# Patient Record
Sex: Female | Born: 1965 | Race: White | Hispanic: No | Smoking: Current some day smoker
Health system: Southern US, Community
[De-identification: ages and names within clinical notes are randomized; demographics above are authoritative.]

## PROBLEM LIST (undated history)

## (undated) DIAGNOSIS — T7840XA Allergy, unspecified, initial encounter: Secondary | ICD-10-CM

## (undated) DIAGNOSIS — K219 Gastro-esophageal reflux disease without esophagitis: Secondary | ICD-10-CM

## (undated) DIAGNOSIS — E079 Disorder of thyroid, unspecified: Secondary | ICD-10-CM

## (undated) HISTORY — PX: ABDOMINAL HYSTERECTOMY: SHX81

## (undated) HISTORY — PX: GALLBLADDER SURGERY: SHX652

## (undated) HISTORY — DX: Gastro-esophageal reflux disease without esophagitis: K21.9

## (undated) HISTORY — DX: Disorder of thyroid, unspecified: E07.9

## (undated) HISTORY — DX: Allergy, unspecified, initial encounter: T78.40XA

---

## 2010-05-25 ENCOUNTER — Ambulatory Visit: Payer: Self-pay | Admitting: Unknown Physician Specialty

## 2010-05-31 ENCOUNTER — Ambulatory Visit: Payer: Self-pay | Admitting: Unknown Physician Specialty

## 2011-04-05 ENCOUNTER — Ambulatory Visit: Payer: Self-pay | Admitting: Internal Medicine

## 2014-01-12 ENCOUNTER — Ambulatory Visit: Payer: BC Managed Care – PPO | Admitting: Podiatry

## 2014-01-12 ENCOUNTER — Encounter: Payer: Self-pay | Admitting: Podiatry

## 2014-01-12 VITALS — BP 126/94 | HR 115 | Resp 16 | Ht 65.0 in | Wt 164.0 lb

## 2014-01-12 DIAGNOSIS — L600 Ingrowing nail: Secondary | ICD-10-CM

## 2014-01-12 DIAGNOSIS — L608 Other nail disorders: Secondary | ICD-10-CM

## 2014-01-12 MED ORDER — NEOMYCIN-POLYMYXIN-HC 3.5-10000-1 OT SOLN: OTIC | Status: DC

## 2014-01-12 MED ORDER — NEOMYCIN-POLYMYXIN-HC 3.5-10000-1 OT SOLN: OTIC | Status: AC

## 2014-01-12 NOTE — Progress Notes (Signed)
   Subjective:    Patient ID: Ashlee ShadSandra Coppola, female    DOB: 1966-02-21, 48 y.o.   MRN: [id number]  HPI Comments: Have a problem with my toenails . They are thick and discolored , sometimes bleeds when they grow out. Painful , its been going on for years and getting worse . Try to keep them trimmed back so they do not hurt     Review of Systems  Constitutional: Positive for activity change.  All other systems reviewed and are negative.       Objective:   Physical Exam: Vital signs are stable she is alert and oriented x3 I have reviewed her past medical history medications allergies surgeries social history and review of systems. Pulses are palpable +2/4 DPPT bilateral. Capillary fill time to digits one through 5 bilateral is immediate. Neurologic sensorium is intact per Semmes-Weinstein monofilament to the forefoot midfoot and referred bilateral. Deep tendon reflexes are intact and brisk bilateral. Muscle strength was 5 over 5 dorsiflexors plantar flexors inverters everters all intrinsic musculature is intact. Orthopedic evaluation demonstrates all joints distal to the ankle a full range of motion without crepitation. Cutaneous evaluation demonstrates supple well hydrated cutis nail dystrophy to mini of the nails with sharp incurvated nail margins to the tibial and fibular borders of the hallux bilateral. These exquisitely painful palpation with erythema and edema and purulence. He also appear to be either dystrophic mycotic        Assessment & Plan:  Assessment: Ingrown nail tibial and fibular border hallux bilateral abscess paronychia. Nail dystrophy or onychomycosis hallux bilateral.  Plan: Discussed etiology pathology conservative or surgical therapies. Performed a chemical matrixectomy to the tibial and fibular border of the hallux bilateral under local anesthesia today she tolerated this procedure very well. She will start soaking on a twice a day basis in Betadine and water starting  tomorrow apply Cortisporin otic which was written for her. We did send his nail margins out for pathologic culture. And I will followup with her in one week.

## 2014-01-12 NOTE — Patient Instructions (Signed)

## 2014-01-19 ENCOUNTER — Encounter: Payer: Self-pay | Admitting: Podiatry

## 2014-01-19 ENCOUNTER — Ambulatory Visit: Payer: BC Managed Care – PPO | Admitting: Podiatry

## 2014-01-19 VITALS — BP 135/92 | HR 104 | Resp 16

## 2014-01-19 DIAGNOSIS — M79609 Pain in unspecified limb: Secondary | ICD-10-CM

## 2014-01-19 DIAGNOSIS — L600 Ingrowing nail: Secondary | ICD-10-CM

## 2014-01-19 NOTE — Progress Notes (Signed)
Both great toenails are doing fine. She continues to soak in Betadine and water. She states that they're doing great at absolutely no pain. Objective evaluation reveals vital signs are stable she is alert and oriented x3. Sharp incurvated nail margins were removed last week and she's doing quite well with him.  Objective: Vital signs are stable she is alert and oriented x3. There is no erythema edema cellulitis drainage or odor. Hallux nail margins appear to be healing quite nicely.  Assessment: Well-healing surgical foot discontinue Betadine.  Plan: She will discontinue Betadine she will start with Epsom salts warm water soaks continue to soak and to completely healed. We will notify her when her biopsies come back

## 2014-02-14 ENCOUNTER — Telehealth: Payer: Self-pay | Admitting: Podiatry

## 2014-02-14 ENCOUNTER — Encounter: Payer: Self-pay | Admitting: Podiatry

## 2014-02-14 NOTE — Telephone Encounter (Signed)
LEFT MESSAGE FOR Ashlee Guerrero TO SCHEDULE AN APPOINTMENT FOR PATHOLOGY RESULTS

## 2014-02-15 ENCOUNTER — Telehealth: Payer: Self-pay | Admitting: *Deleted

## 2014-02-15 NOTE — Telephone Encounter (Signed)
Pt stated she does not want to come in for pathology results. Said she works in [employer] and would have to take 1/2 day off. Did you want us to go over results over the phone with her or does pt have to come in?

## 2014-02-15 NOTE — Telephone Encounter (Signed)
You may go over the results with her by phone.  Let me know if she wants to do an oral antifungal.  Give me her information and I will help you with this.

## 2014-02-17 ENCOUNTER — Telehealth: Payer: Self-pay | Admitting: *Deleted

## 2014-02-17 NOTE — Telephone Encounter (Signed)
Left message letting her know per dr Al Corpushyatt fungus is postive, will need rx for lamisil and need blood work. Pt can come by to pick up information or make appt per dr Al Corpushyatt.

## 2014-03-23 ENCOUNTER — Ambulatory Visit (INDEPENDENT_AMBULATORY_CARE_PROVIDER_SITE_OTHER): Payer: BC Managed Care – PPO | Admitting: Podiatry

## 2014-03-23 VITALS — BP 152/109 | HR 111 | Resp 16

## 2014-03-23 DIAGNOSIS — B351 Tinea unguium: Secondary | ICD-10-CM

## 2014-03-23 MED ORDER — TERBINAFINE HCL 250 MG PO TABS
250.0000 mg | ORAL_TABLET | Freq: Every day | ORAL | Status: DC
Start: 2014-03-23 — End: 2014-05-04

## 2014-03-23 NOTE — Progress Notes (Signed)
She presents today for followup of her fungal results. We did get a positive for he for fungus the bilateral toenails.  Objective: Signs are stable she is alert and oriented x3. Onychomycosis.  Assessment: Onychomycosis.  Plan: We discussed the etiology pathology conservative versus surgical therapies today. We discussed laser therapy versus topical therapy versus oral therapy for onychomycosis. At this point we have chosen to go with oral Lamisil therapy. I wrote a prescription for 30 day dose of Lamisil. We also sent her out for liver profile and CBC. I will followup with her in one month for another set of blood work and a 90 day prescription of Lamisil.

## 2014-03-24 LAB — CBC WITH DIFFERENTIAL/PLATELET
Basophils Absolute: 0 10*3/uL (ref 0.0–0.2)
Basos: 1 %
EOS: 3 %
Eosinophils Absolute: 0.2 10*3/uL (ref 0.0–0.4)
HCT: 42.4 % (ref 34.0–46.6)
Hemoglobin: 14.4 g/dL (ref 11.1–15.9)
IMMATURE GRANULOCYTES: 0 %
Immature Grans (Abs): 0 10*3/uL (ref 0.0–0.1)
LYMPHS: 34 %
Lymphocytes Absolute: 2 10*3/uL (ref 0.7–3.1)
MCH: 31 pg (ref 26.6–33.0)
MCHC: 34 g/dL (ref 31.5–35.7)
MCV: 91 fL (ref 79–97)
MONOCYTES: 7 %
MONOS ABS: 0.4 10*3/uL (ref 0.1–0.9)
NEUTROS PCT: 55 %
Neutrophils Absolute: 3.3 10*3/uL (ref 1.4–7.0)
RBC: 4.64 x10E6/uL (ref 3.77–5.28)
RDW: 13.1 % (ref 12.3–15.4)
WBC: 6 10*3/uL (ref 3.4–10.8)

## 2014-03-24 LAB — HEPATIC FUNCTION PANEL: BILIRUBIN DIRECT: 0.11 mg/dL (ref 0.00–0.40)

## 2014-03-28 ENCOUNTER — Encounter: Payer: Self-pay | Admitting: Podiatry

## 2014-05-04 ENCOUNTER — Encounter: Payer: Self-pay | Admitting: Podiatry

## 2014-05-04 ENCOUNTER — Ambulatory Visit (INDEPENDENT_AMBULATORY_CARE_PROVIDER_SITE_OTHER): Payer: BC Managed Care – PPO | Admitting: Podiatry

## 2014-05-04 DIAGNOSIS — Z79899 Other long term (current) drug therapy: Secondary | ICD-10-CM

## 2014-05-04 MED ORDER — TERBINAFINE HCL 250 MG PO TABS: 250.0000 mg | ORAL_TABLET | Freq: Every day | ORAL | Status: AC

## 2014-05-04 NOTE — Progress Notes (Signed)
She presents today for followup of her Lamisil therapy. States that she's not see change as of yet to the nail plate. She denies fever chills nausea vomiting muscle aches pains rashes or itching.  Objective: Vital signs are stable she is alert and oriented x3. No change in nail plate.  Assessment: Onychomycosis.  Plan: Continue the use of Lamisil for another 90 days. A prescription for this was made today. We will also check the blood work with a CBC and liver profile. I will followup with her once his lab work come back otherwise I will see her in 4 months

## 2014-05-18 ENCOUNTER — Telehealth: Payer: Self-pay | Admitting: *Deleted

## 2014-05-18 LAB — CBC WITH DIFFERENTIAL/PLATELET
BASOS: 1 %
Basophils Absolute: 0 10*3/uL (ref 0.0–0.2)
EOS ABS: 0.2 10*3/uL (ref 0.0–0.4)
Eos: 2 %
HCT: 42.2 % (ref 34.0–46.6)
Hemoglobin: 14.6 g/dL (ref 11.1–15.9)
Immature Grans (Abs): 0 10*3/uL (ref 0.0–0.1)
Immature Granulocytes: 0 %
Lymphocytes Absolute: 2 10*3/uL (ref 0.7–3.1)
Lymphs: 28 %
MCH: 31.5 pg (ref 26.6–33.0)
MCHC: 34.6 g/dL (ref 31.5–35.7)
MCV: 91 fL (ref 79–97)
Monocytes Absolute: 0.5 10*3/uL (ref 0.1–0.9)
Monocytes: 8 %
NEUTROS ABS: 4.4 10*3/uL (ref 1.4–7.0)
Neutrophils Relative %: 61 %
RBC: 4.64 x10E6/uL (ref 3.77–5.28)
RDW: 13.5 % (ref 12.3–15.4)
WBC: 7.2 10*3/uL (ref 3.4–10.8)

## 2014-05-18 LAB — HEPATIC FUNCTION PANEL
ALBUMIN: 4.2 g/dL (ref 3.5–5.5)
ALK PHOS: 92 IU/L (ref 39–117)
ALT: 10 IU/L (ref 0–32)
AST: 15 IU/L (ref 0–40)
BILIRUBIN TOTAL: 0.3 mg/dL (ref 0.0–1.2)
Bilirubin, Direct: 0.07 mg/dL (ref 0.00–0.40)
Total Protein: 6.6 g/dL (ref 6.0–8.5)

## 2014-05-18 NOTE — Telephone Encounter (Signed)
Message copied by Bing ReeGUNN, Gabby Rackers L on Wed May 18, 2014 11:29 AM ------      Message from: Ernestene KielHYATT, MAX T      Created: Wed May 18, 2014  6:43 AM       Blood work looks perfect continue medication. ------

## 2014-05-18 NOTE — Telephone Encounter (Signed)
Left message regarding blood work looks good .

## 2014-09-07 ENCOUNTER — Ambulatory Visit: Payer: BC Managed Care – PPO | Admitting: Podiatry

## 2014-09-11 ENCOUNTER — Emergency Department: Payer: Self-pay | Admitting: Emergency Medicine

## 2014-09-11 LAB — CBC
HCT: 44.2 % (ref 35.0–47.0)
HGB: 14.8 g/dL (ref 12.0–16.0)
MCH: 31.3 pg (ref 26.0–34.0)
MCHC: 33.5 g/dL (ref 32.0–36.0)
MCV: 94 fL (ref 80–100)
PLATELETS: 215 10*3/uL (ref 150–440)
RBC: 4.72 10*6/uL (ref 3.80–5.20)
RDW: 12.7 % (ref 11.5–14.5)
WBC: 8 10*3/uL (ref 3.6–11.0)

## 2014-09-11 LAB — COMPREHENSIVE METABOLIC PANEL
ALK PHOS: 87 U/L
ANION GAP: 6 — AB (ref 7–16)
Albumin: 3.9 g/dL (ref 3.4–5.0)
BILIRUBIN TOTAL: 0.4 mg/dL (ref 0.2–1.0)
BUN: 11 mg/dL (ref 7–18)
CHLORIDE: 104 mmol/L (ref 98–107)
Calcium, Total: 9 mg/dL (ref 8.5–10.1)
Co2: 30 mmol/L (ref 21–32)
Creatinine: 1.06 mg/dL (ref 0.60–1.30)
EGFR (African American): >60
EGFR (Non-African Amer.): 58 — ABNORMAL LOW
Glucose: 119 mg/dL — ABNORMAL HIGH (ref 65–99)
Osmolality: 280 (ref 275–301)
Potassium: 3.1 mmol/L — ABNORMAL LOW (ref 3.5–5.1)
SGOT(AST): 18 U/L (ref 15–37)
SGPT (ALT): 22 U/L
SODIUM: 140 mmol/L (ref 136–145)
TOTAL PROTEIN: 7.4 g/dL (ref 6.4–8.2)

## 2015-04-25 IMAGING — CT CT HEAD WITHOUT CONTRAST
2 series · 16 of 30 positions shown, 20 images · non-contrast
Comparison: None.

CLINICAL DATA: Headache, dizziness, nausea.

EXAM:
CT HEAD WITHOUT CONTRAST
TECHNIQUE: Contiguous axial images were obtained from the base of the skull
through the vertex without intravenous contrast.

[Series 2: head wo · axial · 0.42mm/px · z∈[-193,-81]mm · 13 of 31 slices shown, 17 images]
[im 3/31  brain]
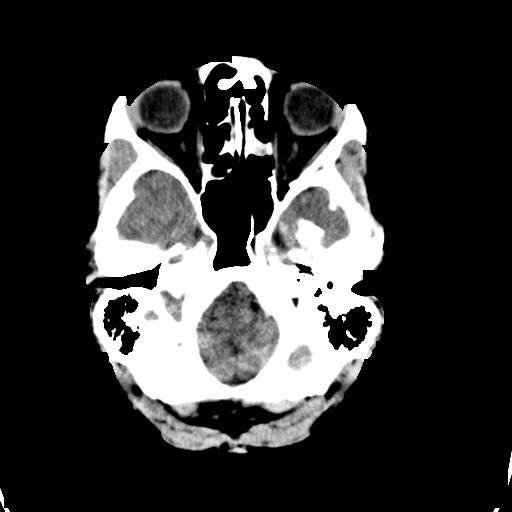
[im 3/31  bone]
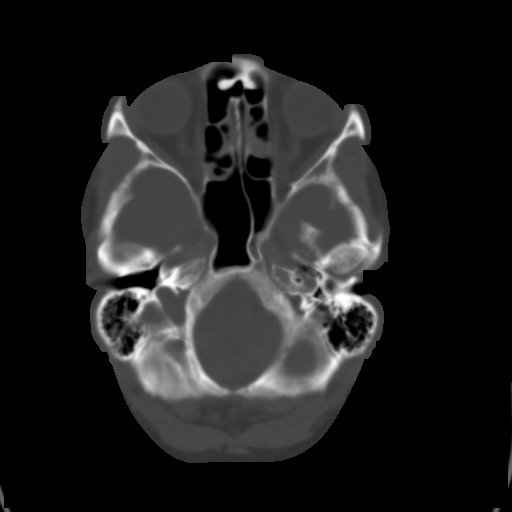
[im 5/31  brain]
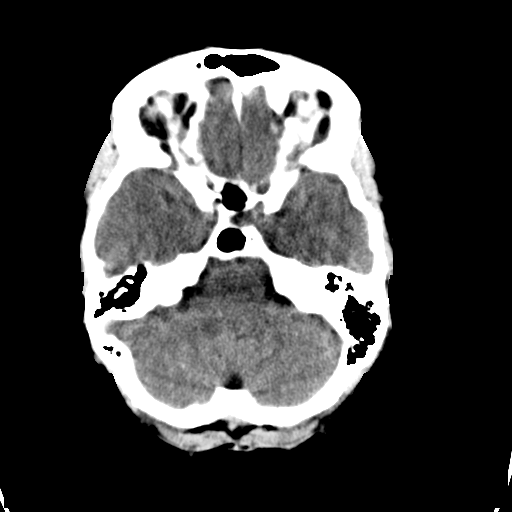
[im 7/31  brain]
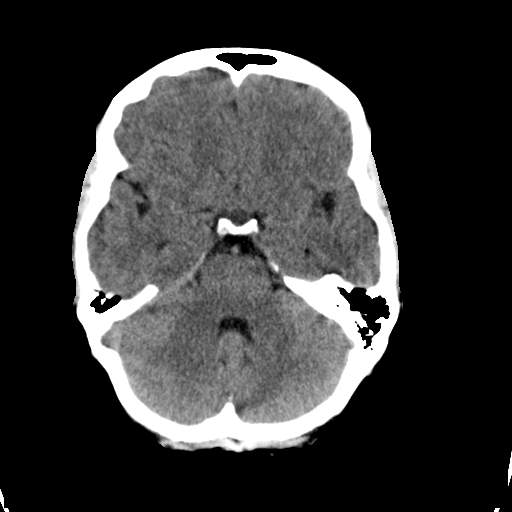
[im 9/31  brain]
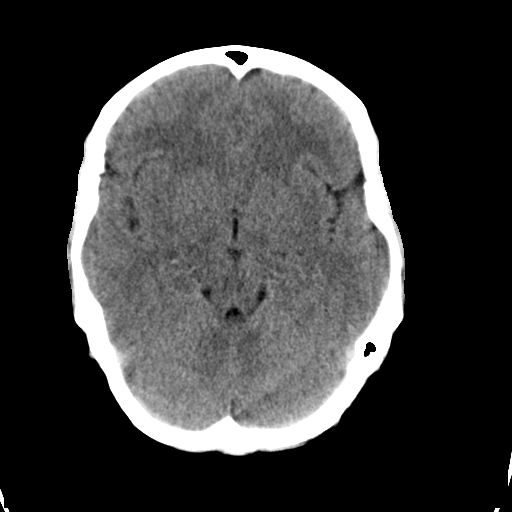
[im 11/31  brain]
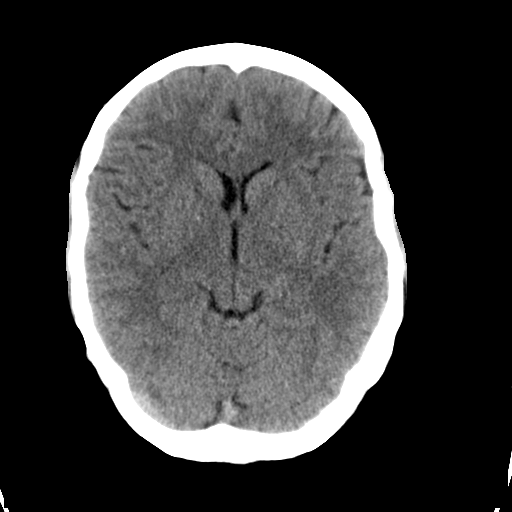
[im 11/31  bone]
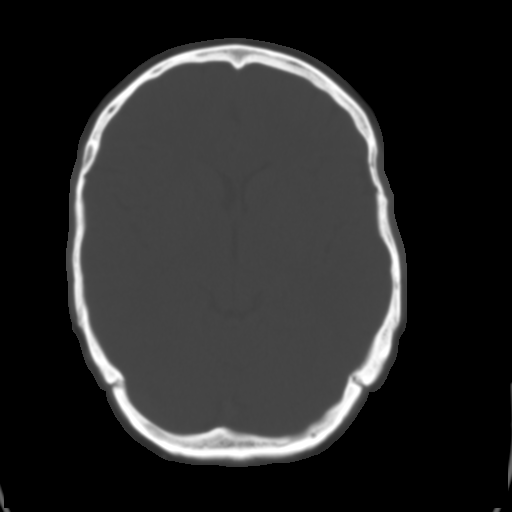
[im 13/31  brain]
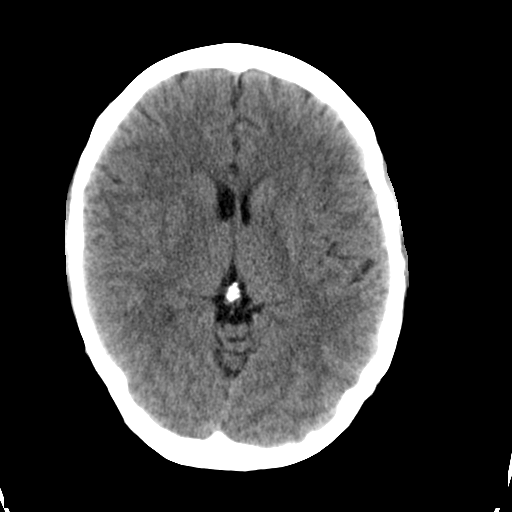
[im 16/31  brain]
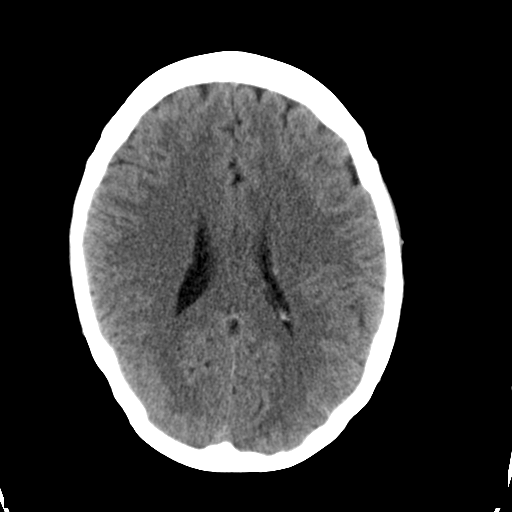
[im 18/31  brain]
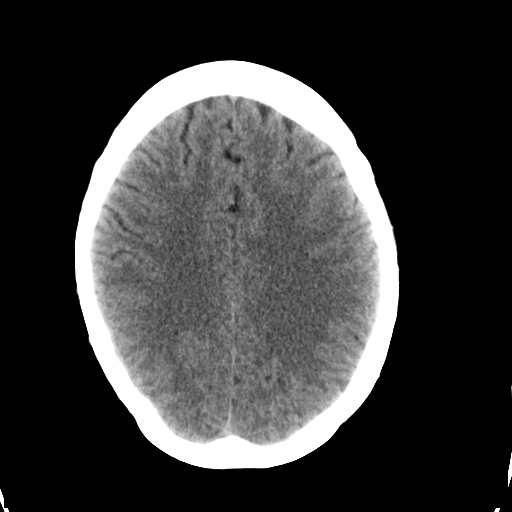
[im 20/31  brain]
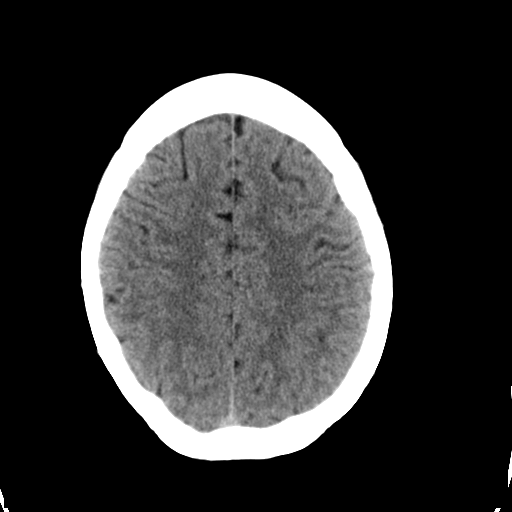
[im 20/31  bone]
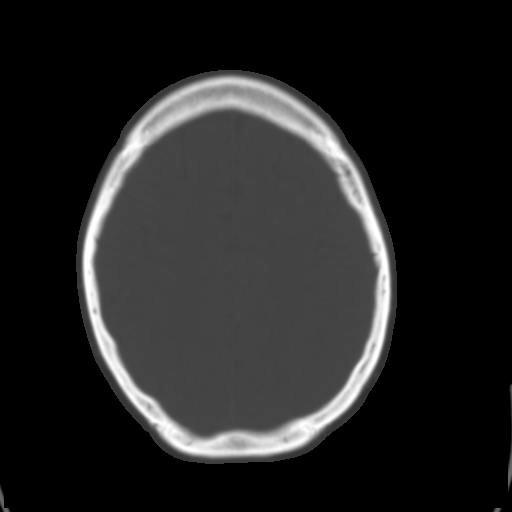
[im 22/31  brain]
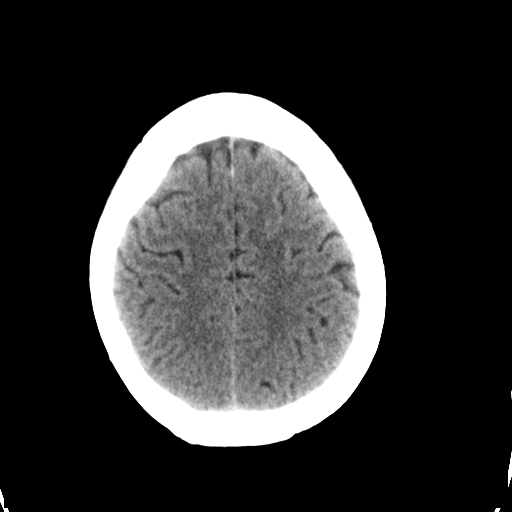
[im 24/31  brain]
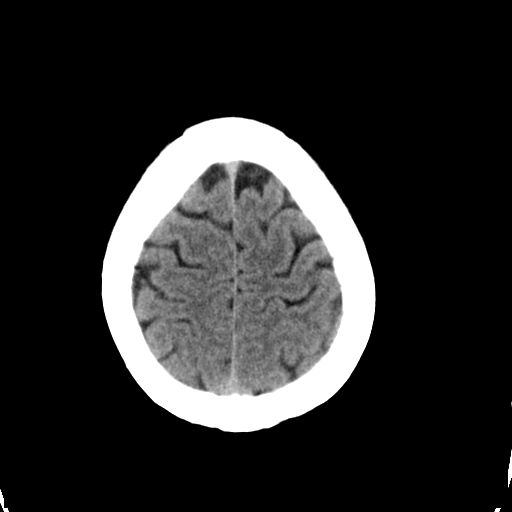
[im 26/31  brain]
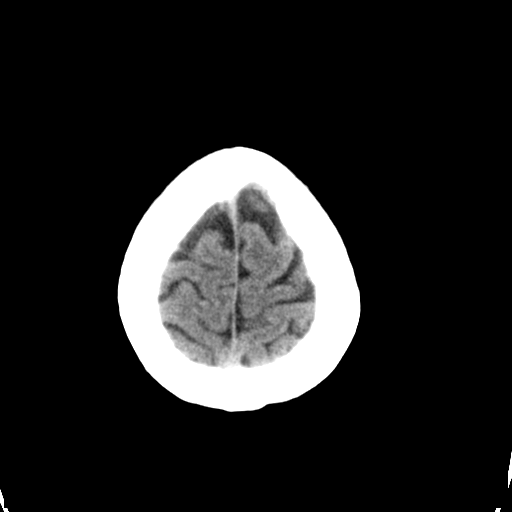
[im 28/31  brain]
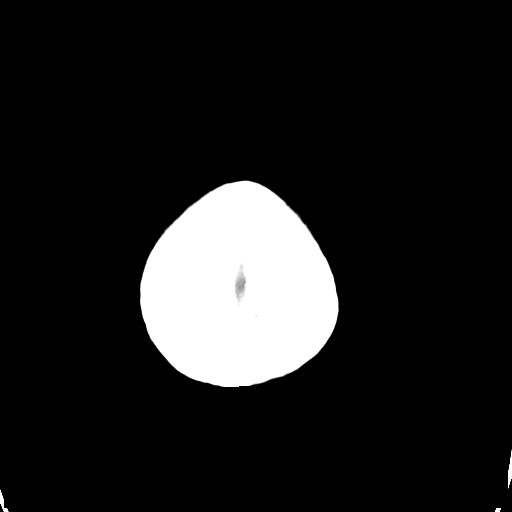
[im 28/31  bone]
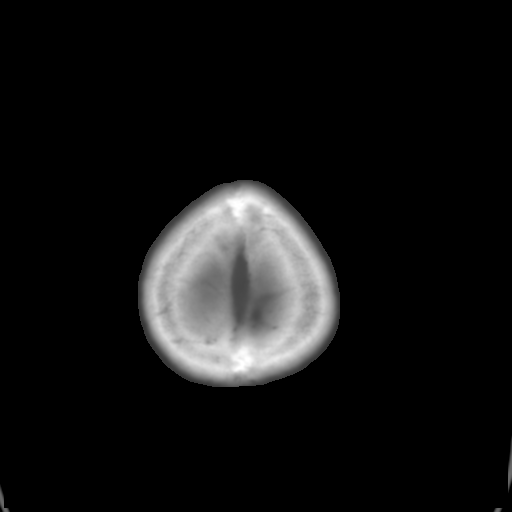

[Series 3: head bone · axial · 0.42mm/px · z∈[-193,-153]mm · 3 of 32 slices shown]
[im 3/32  bone]
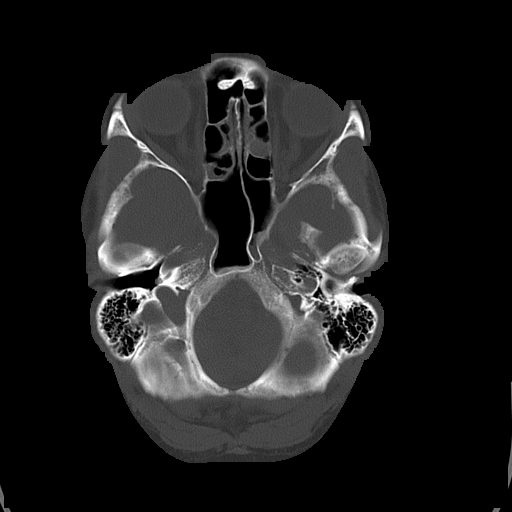
[im 7/32  bone]
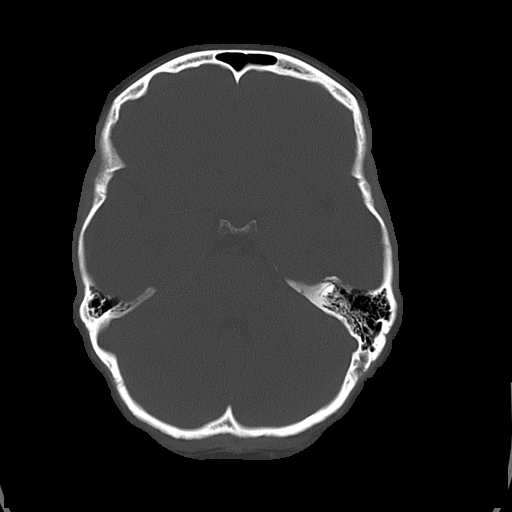
[im 12/32  bone]
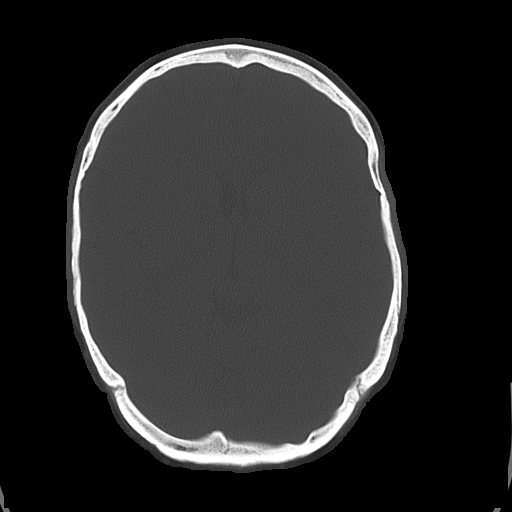

[16 of 30 positions shown; findings below may reference images not displayed]

FINDINGS: Maintained gray-white differentiation. No CT evidence of acute
infarction. No intraparenchymal hemorrhage, mass, mass effect, or
abnormal extra-axial fluid collection. The ventricles, cisterns, and
sulci are normal in size, shape, and position. Partially opacified
ethmoid air cells.
IMPRESSION: No CT evidence of an acute intracranial abnormality.

Partially opacified ethmoid air cells. Correlate clinically for
symptoms of sinusitis.

## 2023-12-10 ENCOUNTER — Encounter: Payer: Self-pay | Admitting: Family Medicine
